# Patient Record
Sex: Male | Born: 1975 | Race: White | Hispanic: No | Marital: Married | Smoking: Never smoker
Health system: Southern US, Community
[De-identification: ages and names within clinical notes are randomized; demographics above are authoritative.]

## PROBLEM LIST (undated history)

## (undated) DIAGNOSIS — K2270 Barrett's esophagus without dysplasia: Secondary | ICD-10-CM

## (undated) DIAGNOSIS — K219 Gastro-esophageal reflux disease without esophagitis: Secondary | ICD-10-CM

## (undated) HISTORY — DX: Barrett's esophagus without dysplasia: K22.70

## (undated) HISTORY — DX: Gastro-esophageal reflux disease without esophagitis: K21.9

---

## 1998-01-06 ENCOUNTER — Emergency Department (HOSPITAL_COMMUNITY): Admission: EM | Admit: 1998-01-06 | Discharge: 1998-01-06 | Payer: Self-pay | Admitting: *Deleted

## 1998-01-13 ENCOUNTER — Encounter: Admission: RE | Admit: 1998-01-13 | Discharge: 1998-04-13 | Payer: Self-pay | Admitting: Family Medicine

## 1999-08-18 ENCOUNTER — Emergency Department (HOSPITAL_COMMUNITY): Admission: EM | Admit: 1999-08-18 | Discharge: 1999-08-18 | Payer: Self-pay | Admitting: Emergency Medicine

## 2000-08-28 HISTORY — PX: CERVICAL FUSION: SHX112

## 2001-01-19 ENCOUNTER — Encounter: Payer: Self-pay | Admitting: Neurology

## 2001-01-19 ENCOUNTER — Ambulatory Visit (HOSPITAL_COMMUNITY): Admission: RE | Admit: 2001-01-19 | Discharge: 2001-01-19 | Payer: Self-pay | Admitting: Neurology

## 2001-02-01 ENCOUNTER — Encounter: Payer: Self-pay | Admitting: Neurosurgery

## 2001-02-01 ENCOUNTER — Inpatient Hospital Stay (HOSPITAL_COMMUNITY): Admission: RE | Admit: 2001-02-01 | Discharge: 2001-02-02 | Payer: Self-pay | Admitting: Neurosurgery

## 2001-02-18 ENCOUNTER — Encounter: Payer: Self-pay | Admitting: Neurosurgery

## 2001-02-18 ENCOUNTER — Encounter: Admission: RE | Admit: 2001-02-18 | Discharge: 2001-02-18 | Payer: Self-pay | Admitting: Neurosurgery

## 2001-03-28 ENCOUNTER — Encounter: Payer: Self-pay | Admitting: Neurosurgery

## 2001-03-28 ENCOUNTER — Encounter: Admission: RE | Admit: 2001-03-28 | Discharge: 2001-03-28 | Payer: Self-pay | Admitting: Neurosurgery

## 2001-07-23 ENCOUNTER — Encounter: Payer: Self-pay | Admitting: Neurosurgery

## 2001-07-23 ENCOUNTER — Encounter: Admission: RE | Admit: 2001-07-23 | Discharge: 2001-07-23 | Payer: Self-pay | Admitting: Neurosurgery

## 2005-11-11 ENCOUNTER — Ambulatory Visit: Payer: Self-pay | Admitting: Family Medicine

## 2006-03-03 ENCOUNTER — Ambulatory Visit: Payer: Self-pay | Admitting: Podiatry

## 2007-07-16 ENCOUNTER — Ambulatory Visit: Payer: Self-pay | Admitting: Family Medicine

## 2009-09-17 ENCOUNTER — Ambulatory Visit: Payer: Self-pay | Admitting: Gastroenterology

## 2014-08-06 ENCOUNTER — Encounter: Payer: Self-pay | Admitting: Gastroenterology

## 2014-09-28 ENCOUNTER — Other Ambulatory Visit: Payer: Self-pay

## 2014-09-29 ENCOUNTER — Ambulatory Visit (INDEPENDENT_AMBULATORY_CARE_PROVIDER_SITE_OTHER): Payer: BLUE CROSS/BLUE SHIELD | Admitting: Gastroenterology

## 2014-09-29 ENCOUNTER — Encounter: Payer: Self-pay | Admitting: Gastroenterology

## 2014-09-29 VITALS — BP 100/80 | HR 68 | Ht 75.5 in | Wt 239.5 lb

## 2014-09-29 DIAGNOSIS — Z86010 Personal history of colonic polyps: Secondary | ICD-10-CM

## 2014-09-29 DIAGNOSIS — K2270 Barrett's esophagus without dysplasia: Secondary | ICD-10-CM

## 2014-09-29 NOTE — Progress Notes (Signed)
HPI: This is a very pleasant 39 year old man whom I am meeting for the first time today.   I reviewed office notes from her nodal clinic that described colonoscopy in which she had at least one or 2 small tubular adenomas. He was also reported to have Barrett's esophagus. I do not have any of the official colonoscopy or upper endoscopy reports or the associated pathology reports.  It seems that he has had trouble with intermittent fecal leakage for some time. In 2011 [facility] and possible biofeedback.  Takes prilosec once daily, feels great.  If he misses, terrible pyrosis.  No dsyphagia.  Weight up 15 pounds in past year.  No changes in bowels, no bleeding.  Still has intermittent fecal incontinence without any changes.    Review of systems: Pertinent positive and negative review of systems were noted in the above HPI section. Complete review of systems was performed and was otherwise normal.    Past Medical History  Diagnosis Date  . Barrett's esophagus   . GERD (gastroesophageal reflux disease)     Past Surgical History  Procedure Laterality Date  . Cervical fusion  2002    C3-C5    Current Outpatient Prescriptions  Medication Sig Dispense Refill  . omeprazole (PRILOSEC) 20 MG capsule Take 20 mg by mouth daily.    . sertraline (ZOLOFT) 50 MG tablet Take 50 mg by mouth daily.     No current facility-administered medications for this visit.    Allergies as of 09/29/2014  . (No Known Allergies)    Family History  Problem Relation Age of Onset  . Esophageal cancer Maternal Grandfather   . Hypertension Father     History   Social History  . Marital Status: Single    Spouse Name: N/A    Number of Children: 2  . Years of Education: N/A   Occupational History  . RN    Social History Main Topics  . Smoking status: Never Smoker   . Smokeless tobacco: Never Used  . Alcohol Use: No  . Drug  Use: No  . Sexual Activity: Not on file   Other Topics Concern  . Not on file   Social History Narrative  . No narrative on file       Physical Exam: BP 100/80 mmHg  Pulse 68  Ht 6' 3.5" (1.918 m)  Wt 239 lb 8 oz (108.636 kg)  BMI 29.53 kg/m2 Constitutional: generally well-appearing Psychiatric: alert and oriented x3 Eyes: extraocular movements intact Mouth: oral pharynx moist, no lesions Neck: supple no lymphadenopathy Cardiovascular: heart regular rate and rhythm Lungs: clear to auscultation bilaterally Abdomen: soft, nontender, nondistended, no obvious ascites, no peritoneal signs, normal bowel sounds Extremities: no lower extremity edema bilaterally Skin: no lesions on visible extremities    Assessment and plan: 39 y.o. male with  history of colon polyps, history of Barrett's esophagus  We will get records sent from his previous gastroenterologist including the actual colonoscopy and upper endoscopy reports with their associated pathology reports. After reviewing those records we will contact him with recommendations on his next recommended surveillance endoscopies.

## 2014-09-29 NOTE — Patient Instructions (Addendum)
We will get records sent from your previous gastroenterologist at [facility] [place] (recent colonoscopy and pathology, recent upper endoscopy and pathology).  After reviewing records, Dr. Christella HartiganJacobs will contact you about timing of your next surveillance upper endoscopy, colonoscopy.

## 2014-12-18 ENCOUNTER — Ambulatory Visit
Admit: 2014-12-18 | Disposition: A | Discharge status: Home / Self Care | Payer: Self-pay | Attending: Orthopedic Surgery | Admitting: Orthopedic Surgery

## 2021-09-30 ENCOUNTER — Other Ambulatory Visit: Payer: Self-pay | Admitting: Sports Medicine

## 2021-09-30 ENCOUNTER — Other Ambulatory Visit (HOSPITAL_COMMUNITY): Payer: Self-pay | Admitting: Sports Medicine

## 2021-09-30 DIAGNOSIS — M1120 Other chondrocalcinosis, unspecified site: Secondary | ICD-10-CM

## 2021-09-30 DIAGNOSIS — G8929 Other chronic pain: Secondary | ICD-10-CM

## 2021-09-30 DIAGNOSIS — M25461 Effusion, right knee: Secondary | ICD-10-CM

## 2021-09-30 DIAGNOSIS — M25561 Pain in right knee: Secondary | ICD-10-CM

## 2021-10-10 ENCOUNTER — Other Ambulatory Visit: Payer: Self-pay

## 2021-10-10 ENCOUNTER — Ambulatory Visit
Admission: RE | Admit: 2021-10-10 | Discharge: 2021-10-10 | Disposition: A | Discharge status: Home / Self Care | Payer: 59 | Source: Ambulatory Visit | Attending: Sports Medicine | Admitting: Sports Medicine

## 2021-10-10 DIAGNOSIS — G8929 Other chronic pain: Secondary | ICD-10-CM | POA: Insufficient documentation

## 2021-10-10 DIAGNOSIS — M25561 Pain in right knee: Secondary | ICD-10-CM | POA: Diagnosis present

## 2021-10-10 DIAGNOSIS — M1120 Other chondrocalcinosis, unspecified site: Secondary | ICD-10-CM | POA: Diagnosis present

## 2021-10-10 DIAGNOSIS — M25461 Effusion, right knee: Secondary | ICD-10-CM | POA: Diagnosis present

## 2022-10-09 IMAGING — MR MR KNEE*R* W/O CM
6 series · 40 of 40 positions shown · non-contrast
Comparison: None.

CLINICAL DATA: Right lateral knee pain, progressively worsening.
Limited and painful range of motion.

EXAM:
MRI OF THE RIGHT KNEE WITHOUT CONTRAST
TECHNIQUE: Multiplanar, multisequence MR imaging of the knee was performed. No
intravenous contrast was administered.

[Series 8: T2 fat-sat · axial · right · 4.0mm · 0.50mm/px · z∈[-82,+43]mm · 5 of 26 slices shown (1 of 3)]
[im 1/26]
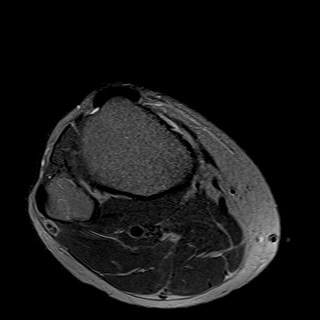
[im 7/26]
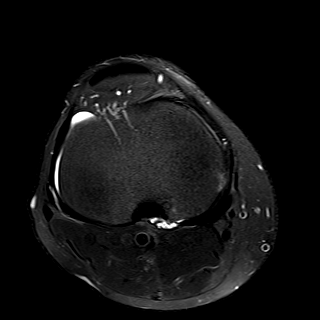
[im 13/26]
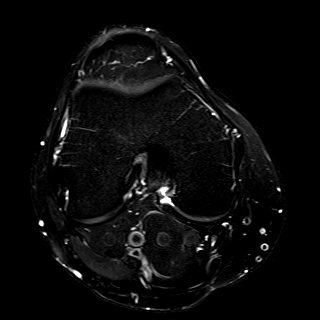
[im 19/26]
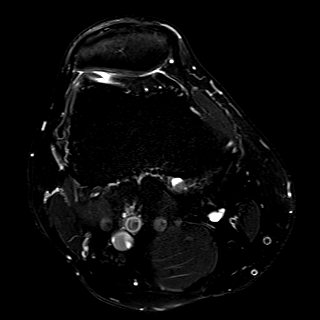
[im 26/26]
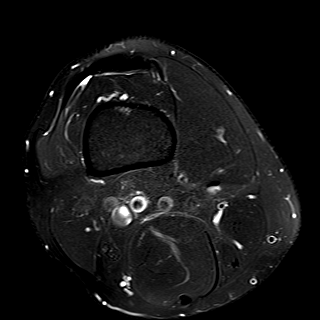

[Series 9: T1 · coronal · right · 4.0mm · 0.47mm/px · 7 of 32 slices shown]
[im 1/32]
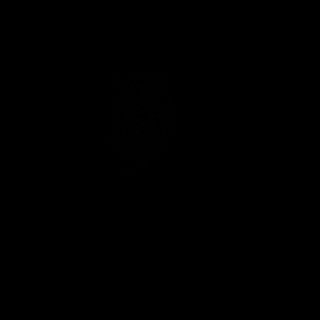
[im 6/32]
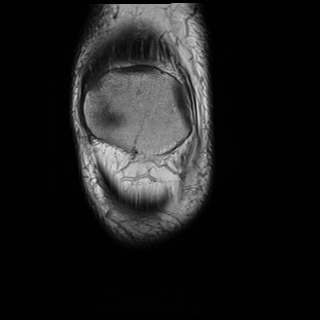
[im 11/32]
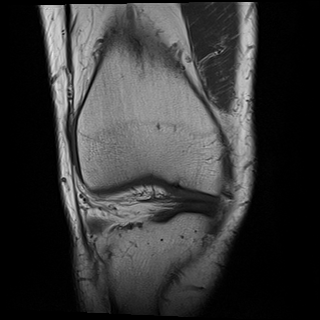
[im 16/32]
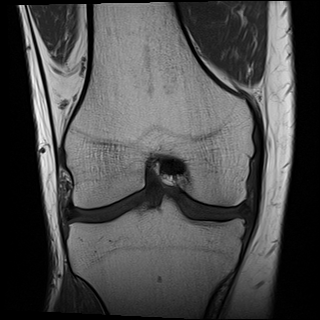
[im 21/32]
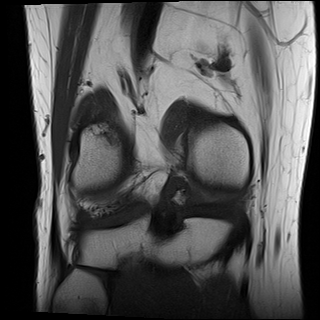
[im 26/32]
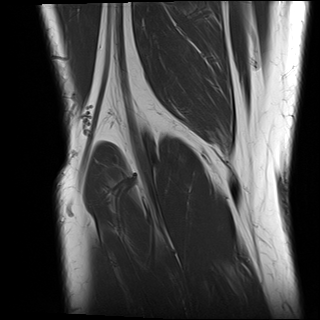
[im 32/32]
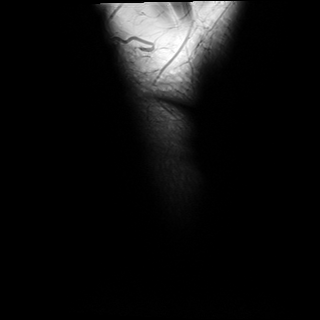

[Series 10: T2 fat-sat · coronal · right · 4.0mm · 0.47mm/px · 7 of 32 slices shown (2 of 3)]
[im 1/32]
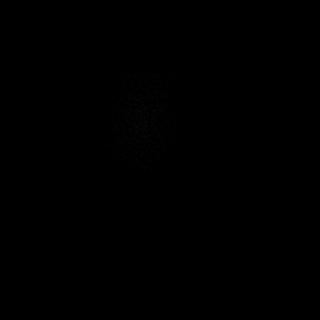
[im 6/32]
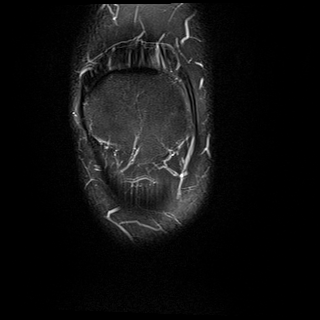
[im 11/32]
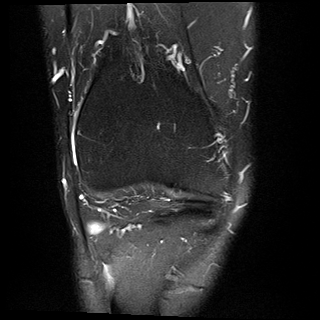
[im 16/32]
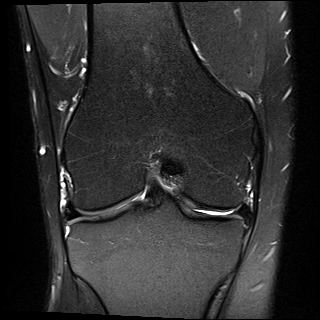
[im 21/32]
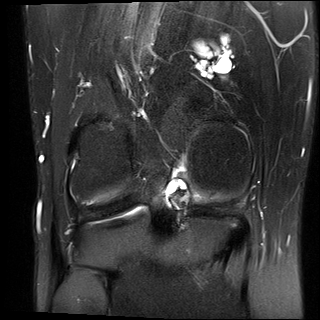
[im 26/32]
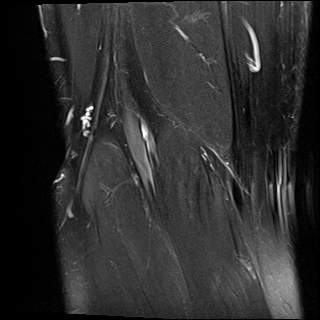
[im 32/32]
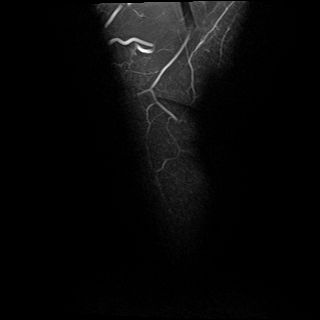

[Series 11: PD fat-sat · coronal · right · 4.0mm · 0.59mm/px · 7 of 32 slices shown (1 of 2)]
[im 1/32]
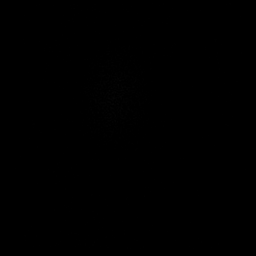
[im 6/32]
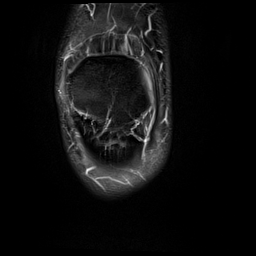
[im 11/32]
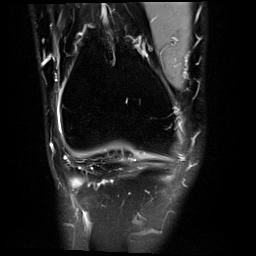
[im 16/32]
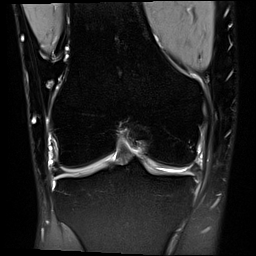
[im 21/32]
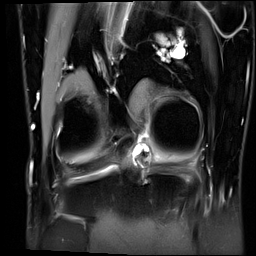
[im 26/32]
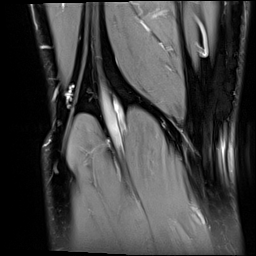
[im 32/32]
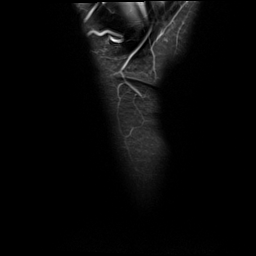

[Series 12: PD fat-sat · sagittal · right · 3.0mm · 0.47mm/px · 7 of 33 slices shown (2 of 2)]
[im 1/33]
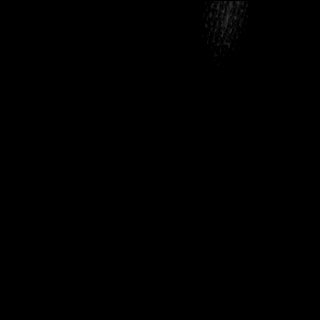
[im 6/33]
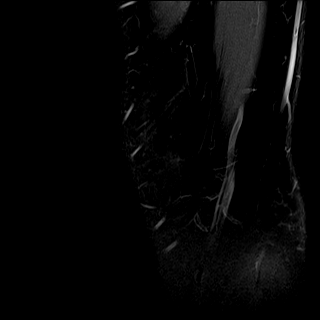
[im 11/33]
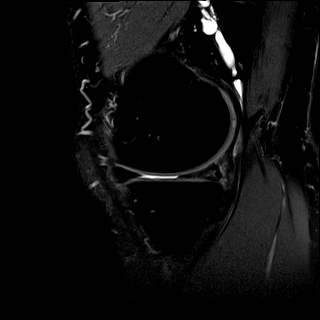
[im 17/33]
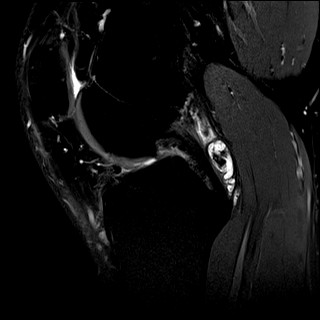
[im 22/33]
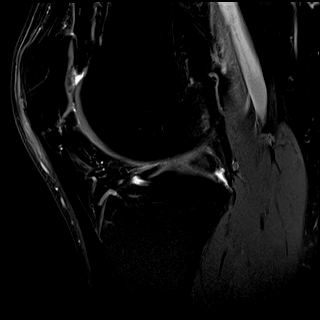
[im 27/33]
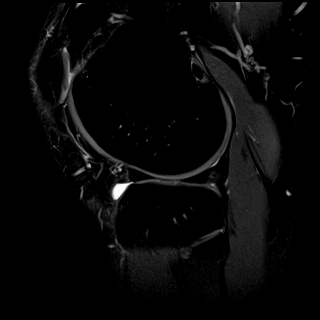
[im 33/33]
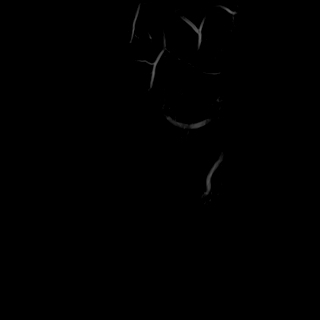

[Series 13: T2 fat-sat · sagittal · right · 3.0mm · 0.47mm/px · 7 of 34 slices shown (3 of 3)]
[im 1/34]
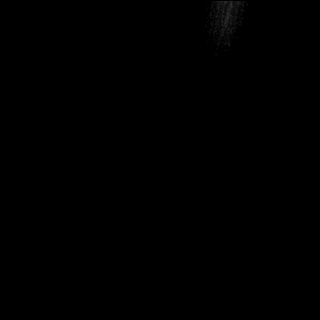
[im 6/34]
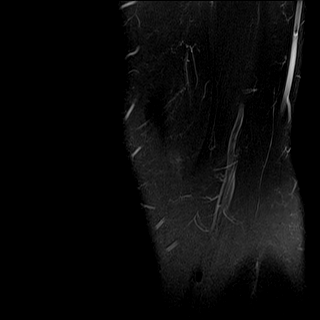
[im 12/34]
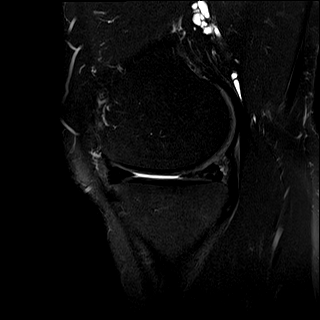
[im 17/34]
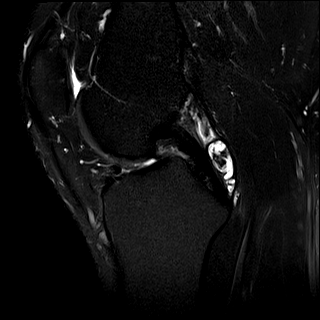
[im 23/34]
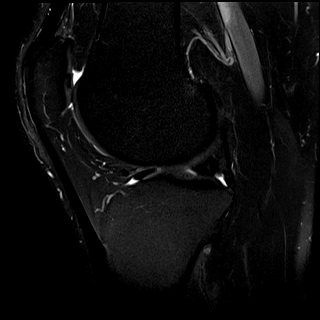
[im 28/34]
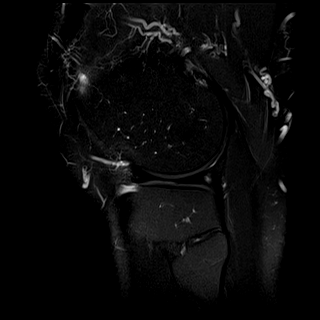
[im 34/34]
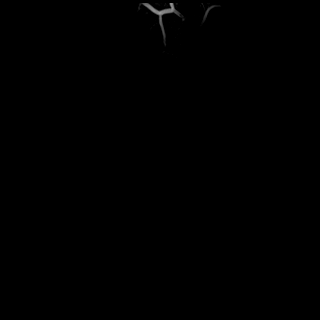

[40 of 40 positions shown; findings below may reference images not displayed]

FINDINGS: MENISCI

Medial meniscus: The posterior horn and posterior body are markedly
degenerated. There is undersurface tearing along the posterior horn
and a horizontal tear reaching the meniscal undersurface in the
posterior body.

Lateral meniscus:  Intact.

LIGAMENTS

Cruciates:  Intact.

Collaterals:  Intact.

CARTILAGE

Patellofemoral: There is some fraying and irregularity of the
hyaline cartilage surface along the lateral facet in the midpole.

Medial:  Mildly thinned without focal defect.

Lateral:  Preserved.

Joint: Trace amount of joint fluid. 2-3 loose bodies are seen
posterior to PCL just medial to its attachment to the tibia. The
largest loose body measures 0.8 cm.

Popliteal Fossa:  Small Baker's cyst.

Extensor Mechanism:  Intact.

Bones:  No fracture, stress change or worrisome lesion.

Other: None.
IMPRESSION: Markedly degenerated posterior horn and posterior body of the medial
meniscus with marked fraying along the undersurface of the posterior
horn and a horizontal tear in the posterior body.

Overall mild appearing osteoarthritis. 2-3 loose bodies posterior
and just medial to the inferior aspect of the PCL noted.

Small Baker's cyst.
